# Patient Record
Sex: Male | Born: 1935 | Race: White | Hispanic: Yes | Marital: Married | Smoking: Never smoker
Health system: Southern US, Community
[De-identification: ages and names within clinical notes are randomized; demographics above are authoritative.]

## PROBLEM LIST (undated history)

## (undated) DIAGNOSIS — J8281 Chronic eosinophilic pneumonia: Secondary | ICD-10-CM

## (undated) DIAGNOSIS — I509 Heart failure, unspecified: Secondary | ICD-10-CM

## (undated) HISTORY — PX: HIP SURGERY: SHX245

## (undated) HISTORY — PX: APPENDECTOMY: SHX54

## (undated) HISTORY — DX: Chronic eosinophilic pneumonia: J82.81

## (undated) HISTORY — PX: KNEE SURGERY: SHX244

## (undated) HISTORY — PX: CATARACT EXTRACTION: SUR2

## (undated) HISTORY — DX: Heart failure, unspecified: I50.9

## (undated) HISTORY — PX: MASTOIDECTOMY: SHX711

---

## 2020-05-17 ENCOUNTER — Other Ambulatory Visit: Payer: Self-pay

## 2020-05-17 ENCOUNTER — Encounter (HOSPITAL_BASED_OUTPATIENT_CLINIC_OR_DEPARTMENT_OTHER): Payer: Self-pay

## 2020-05-17 ENCOUNTER — Emergency Department (HOSPITAL_BASED_OUTPATIENT_CLINIC_OR_DEPARTMENT_OTHER)
Admission: EM | Admit: 2020-05-17 | Discharge: 2020-05-17 | Disposition: A | Discharge status: Home / Self Care | Payer: Medicare Other | Attending: Emergency Medicine | Admitting: Emergency Medicine

## 2020-05-17 DIAGNOSIS — Y9289 Other specified places as the place of occurrence of the external cause: Secondary | ICD-10-CM | POA: Insufficient documentation

## 2020-05-17 DIAGNOSIS — M795 Residual foreign body in soft tissue: Secondary | ICD-10-CM | POA: Diagnosis present

## 2020-05-17 DIAGNOSIS — W458XXA Other foreign body or object entering through skin, initial encounter: Secondary | ICD-10-CM | POA: Diagnosis not present

## 2020-05-17 DIAGNOSIS — T148XXA Other injury of unspecified body region, initial encounter: Secondary | ICD-10-CM | POA: Diagnosis not present

## 2020-05-17 DIAGNOSIS — I509 Heart failure, unspecified: Secondary | ICD-10-CM | POA: Insufficient documentation

## 2020-05-17 DIAGNOSIS — Y998 Other external cause status: Secondary | ICD-10-CM | POA: Diagnosis not present

## 2020-05-17 DIAGNOSIS — Y9389 Activity, other specified: Secondary | ICD-10-CM | POA: Diagnosis not present

## 2020-05-17 MED ORDER — BACITRACIN ZINC 500 UNIT/GM EX OINT: TOPICAL_OINTMENT | Freq: Once | CUTANEOUS | Status: DC

## 2020-05-17 MED ORDER — LIDOCAINE HCL (PF) 1 % IJ SOLN
5.0000 mL | Freq: Once | INTRAMUSCULAR | Status: AC
  Administered 2020-05-17: 5 mL
  Filled 2020-05-17: qty 5

## 2020-05-17 MED ORDER — LIDOCAINE HCL (PF) 1 % IJ SOLN
INTRAMUSCULAR | Status: AC
  Filled 2020-05-17: qty 5

## 2020-05-17 NOTE — ED Notes (Signed)
Area cleaned, sm amt of bacitracin oint applied, bandage then applied. Pt instructed on keeping area clean and dry and observing for signs and symptoms of infection. Opportunity for questions provided. Copy of AVS provided

## 2020-05-17 NOTE — ED Notes (Signed)
Presents with fish hook in left 4th digit, no active bleeding noted. Pt states he had a tetanus 2 weeks ago. ED PA at bedside

## 2020-05-17 NOTE — Discharge Instructions (Signed)
Follow instructions provided in this packet.  Please wash the area with soap and water several times a day over the next several days.  Return immediately with worsening pain, swelling, difficulty bending or extending your finger, significant redness or other concerns.

## 2020-05-17 NOTE — ED Provider Notes (Signed)
[facility] EMERGENCY DEPARTMENT Provider Note   CSN: [id number] Arrival date & time: 05/17/20  1455     History Chief Complaint  Patient presents with  . Foreign Body in Skin    George Roman is a 86 y.o. male.  Patient presents to the emergency department after he got a fishhook embedded in his left fourth digit approximately 1 hour prior to arrival.  George Roman was not being used and was reportedly clean.  Last tetanus 2 weeks ago.  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  He denies any numbness or tingling of the finger.  No treatments prior to arrival.        Past Medical History:  Diagnosis Date  . CHF (congestive heart failure) (HCC)   . Idiopathic eosinophilic pneumonia     There are no problems to display for this patient.   Past Surgical History:  Procedure Laterality Date  . APPENDECTOMY    . CATARACT EXTRACTION    . HIP SURGERY    . KNEE SURGERY    . MASTOIDECTOMY         No family history on file.  Social History   Tobacco Use  . Smoking status: Never Smoker  . Smokeless tobacco: Never Used  Vaping Use  . Vaping Use: Never used  Substance Use Topics  . Alcohol use: Yes    Comment: daily  . Drug use: Never    Home Medications Prior to Admission medications   Not on File    Allergies    Patient has no known allergies.  Review of Systems   Review of Systems  Musculoskeletal: Negative for joint swelling.  Skin: Positive for wound.  Neurological: Negative for weakness and numbness.    Physical Exam Updated Vital Signs BP (!) 138/97 (BP Location: Right Arm)   Pulse 94   Temp 98.5 F (36.9 C) (Oral)   Resp 18   Ht 5\' 7"  (1.702 m)   Wt 74.8 kg   SpO2 96%   BMI 25.84 kg/m   Physical Exam Vitals and nursing note reviewed.  Constitutional:      Appearance: He is well-developed.  HENT:     Head: Normocephalic and atraumatic.  Eyes:     Conjunctiva/sclera:  Conjunctivae normal.  Pulmonary:     Effort: No respiratory distress.  Musculoskeletal:     Cervical back: Normal range of motion and neck supple.  Skin:    General: Skin is warm and dry.     Comments: Left ring finger: There is a small fishhook embedded on the dorsal aspect of the finger at the level of the PIP joint.  No active bleeding.  Distal sensation intact.  Normal capillary refill at the tip of the digit, less than 2 seconds.  I can palpate the hook and tip, and it is superficial to the bone.  Neurological:     Mental Status: He is alert.     ED Results / Procedures / Treatments   Labs (all labs ordered are listed, but only abnormal results are displayed) Labs Reviewed - No data to display  EKG None  Radiology No results found.  Procedures .Foreign Body Removal  Date/Time: 05/17/2020 4:47 PM Performed by: 07/18/2020, PA-C Authorized by: Renne Crigler, PA-C  Body area: skin General location: upper extremity Location details: left ring finger Anesthesia: local infiltration  Anesthesia: Local Anesthetic: lidocaine 1% without epinephrine Anesthetic total: 1 mL  Localization method: visualized (Small incision made at the hook point to facilitate delivery through skin) Removal mechanism: scalpel Dressing: dressing applied Tendon involvement: none Depth: subcutaneous Complexity: simple 1 objects recovered. Objects recovered: fish hook Post-procedure assessment: foreign body removed Patient tolerance: patient tolerated the procedure well with no immediate complications   (including critical care time)  Medications Ordered in ED Medications  lidocaine (PF) (XYLOCAINE) 1 % injection (has no administration in time range)  bacitracin ointment (has no administration in time range)  lidocaine (PF) (XYLOCAINE) 1 % injection 5 mL (5 mLs Infiltration Given 05/17/20 1631)    ED Course  I have reviewed the triage vital signs and the nursing notes.  Pertinent labs &  imaging results that were available during my care of the patient were reviewed by me and considered in my medical decision making (see chart for details).  Patient seen and examined.  Foreign body removal as denoted.  Patient tolerated well.  After the procedure, I did patient extend his finger at each of the joints and he had excellent extension of the finger with 5 out of 5 strength.  I have low concern for an extensor tendon injury.  Wound care discussed extensively with patient and wife at bedside.  We discussed signs and symptoms to return including worsening pain, swelling, decreased range of motion of the finger, purulent drainage from the finger.  Vital signs reviewed and are as follows: BP (!) 138/97 (BP Location: Right Arm)   Pulse 94   Temp 98.5 F (36.9 C) (Oral)   Resp 18   Ht 5\' 7"  (1.702 m)   Wt 74.8 kg   SpO2 96%   BMI 25.84 kg/m   States that he has a clinic appointment on Tuesday and will have them take a look at the wound then.    MDM Rules/Calculators/A&P                          Patient was fishhook removal.  Fishhook was embedded superficially into the skin.  I have low concern for extensor tendon, nerve, vascular injury.  Patient with normal strength and circulation after procedure.  Patient counseled on wound care as this would be necessary over the next few days.  It is pointed out feel that antibiotics are warranted given minor nature of injury.  Patient counseled on signs and symptoms to return and seems reliable to return if these occur.  Tetanus is up-to-date.   Final Clinical Impression(s) / ED Diagnoses Final diagnoses:  Skin foreign body    Rx / DC Orders ED Discharge Orders    None       Tuesday, Renne Crigler 05/17/20 1650    07/18/20, MD 05/17/20 2037

## 2020-05-17 NOTE — ED Triage Notes (Signed)
Pt with fish hook to left ring finger x 1 hour-NAD-steady gait

## 2021-01-16 ENCOUNTER — Other Ambulatory Visit: Payer: Self-pay

## 2021-01-16 ENCOUNTER — Emergency Department (HOSPITAL_BASED_OUTPATIENT_CLINIC_OR_DEPARTMENT_OTHER): Payer: Medicare Other

## 2021-01-16 ENCOUNTER — Encounter (HOSPITAL_BASED_OUTPATIENT_CLINIC_OR_DEPARTMENT_OTHER): Payer: Self-pay | Admitting: *Deleted

## 2021-01-16 ENCOUNTER — Emergency Department (HOSPITAL_BASED_OUTPATIENT_CLINIC_OR_DEPARTMENT_OTHER)
Admission: EM | Admit: 2021-01-16 | Discharge: 2021-01-16 | Disposition: A | Discharge status: Home / Self Care | Payer: Medicare Other | Attending: Emergency Medicine | Admitting: Emergency Medicine

## 2021-01-16 DIAGNOSIS — S01111A Laceration without foreign body of right eyelid and periocular area, initial encounter: Secondary | ICD-10-CM | POA: Insufficient documentation

## 2021-01-16 DIAGNOSIS — S0990XA Unspecified injury of head, initial encounter: Secondary | ICD-10-CM | POA: Insufficient documentation

## 2021-01-16 DIAGNOSIS — W010XXA Fall on same level from slipping, tripping and stumbling without subsequent striking against object, initial encounter: Secondary | ICD-10-CM | POA: Diagnosis not present

## 2021-01-16 DIAGNOSIS — I6782 Cerebral ischemia: Secondary | ICD-10-CM | POA: Insufficient documentation

## 2021-01-16 DIAGNOSIS — S61411A Laceration without foreign body of right hand, initial encounter: Secondary | ICD-10-CM | POA: Insufficient documentation

## 2021-01-16 DIAGNOSIS — I509 Heart failure, unspecified: Secondary | ICD-10-CM | POA: Insufficient documentation

## 2021-01-16 DIAGNOSIS — W19XXXA Unspecified fall, initial encounter: Secondary | ICD-10-CM

## 2021-01-16 DIAGNOSIS — Y92481 Parking lot as the place of occurrence of the external cause: Secondary | ICD-10-CM | POA: Diagnosis not present

## 2021-01-16 DIAGNOSIS — S80211A Abrasion, right knee, initial encounter: Secondary | ICD-10-CM | POA: Insufficient documentation

## 2021-01-16 DIAGNOSIS — S0591XA Unspecified injury of right eye and orbit, initial encounter: Secondary | ICD-10-CM | POA: Diagnosis present

## 2021-01-16 DIAGNOSIS — Z23 Encounter for immunization: Secondary | ICD-10-CM | POA: Diagnosis not present

## 2021-01-16 MED ORDER — TETANUS-DIPHTH-ACELL PERTUSSIS 5-2.5-18.5 LF-MCG/0.5 IM SUSY
0.5000 mL | PREFILLED_SYRINGE | Freq: Once | INTRAMUSCULAR | Status: AC
  Administered 2021-01-16: 0.5 mL via INTRAMUSCULAR
  Filled 2021-01-16: qty 0.5

## 2021-01-16 MED ORDER — BACITRACIN ZINC 500 UNIT/GM EX OINT: TOPICAL_OINTMENT | Freq: Once | CUTANEOUS | Status: AC

## 2021-01-16 MED ORDER — LIDOCAINE-EPINEPHRINE (PF) 2 %-1:200000 IJ SOLN
20.0000 mL | Freq: Once | INTRAMUSCULAR | Status: AC
  Administered 2021-01-16: 20 mL via INTRADERMAL
  Filled 2021-01-16: qty 20

## 2021-01-16 NOTE — ED Triage Notes (Signed)
Pt. ;reports he was putting his groceries on a cart and the cart got away and he ran away from them and he was running after the cart and fell on the asphalt.  Pt. Had no LOC at time of fall or after.  Pt. Is alert and oriented.  Pt. Did lacerate the outer corner of the R eye at the eyebrow area.  Pt. Also cut the R hand at the Thumb and top side the the R hand the the thumb. Bleeding is controlled but still has some bleeding noted.

## 2021-01-16 NOTE — ED Notes (Signed)
Marva RN is working on [employer]. Skin and placing dressing and other for Pt. Care.  Pt. Is tolerating care well. Marva Charge RN explaining everything as she does it.

## 2021-01-16 NOTE — ED Notes (Signed)
Pt is from [employer]. No calling of report is necessary.

## 2021-01-16 NOTE — ED Triage Notes (Signed)
Pt. Also has a R knee abrasion with bruising and skin disruption.

## 2021-01-16 NOTE — ED Provider Notes (Signed)
[facility] EMERGENCY DEPARTMENT Provider Note   CSN: [id number] Arrival date & time: 01/16/21  1249     History Chief Complaint  Patient presents with  . Fall    George Roman is a 87 y.o. male.  HPI 87 year old male presents after a trip and fall in the parking lot of a grocery store.  He was trying to catch a cart that was running away from him and stumbled and fell.  He has a laceration to his right eyebrow as well as multiple laceration/skin tears to his right hand.  He also scraped his right knee.  He denies any pain besides the lacerations themselves.  Does not think he lost consciousness.  No presyncopal symptoms.  He is on a baby aspirin. He thinks his last tetanus immunization was about 5 years ago.   Past Medical History:  Diagnosis Date  . CHF (congestive heart failure) (HCC)   . Idiopathic eosinophilic pneumonia (HCC)     There are no problems to display for this patient.   Past Surgical History:  Procedure Laterality Date  . APPENDECTOMY    . CATARACT EXTRACTION    . HIP SURGERY    . KNEE SURGERY    . MASTOIDECTOMY         No family history on file.  Social History   Tobacco Use  . Smoking status: Never Smoker  . Smokeless tobacco: Never Used  Vaping Use  . Vaping Use: Never used  Substance Use Topics  . Alcohol use: Yes    Comment: daily  . Drug use: Never    Home Medications Prior to Admission medications   Not on File    Allergies    Patient has no known allergies.  Review of Systems   Review of Systems  Cardiovascular: Negative for chest pain.  Skin: Positive for wound.  Neurological: Negative for dizziness and headaches.    Physical Exam Updated Vital Signs BP 133/88 (BP Location: Right Arm)   Pulse 82   Temp 98.9 F (37.2 C) (Oral)   Resp 20   Ht 5\' 7"  (1.702 m)   Wt 72.3 kg   SpO2 96%   BMI 24.97 kg/m   Physical Exam Vitals and nursing note reviewed.  Constitutional:      Appearance: He is  well-developed and well-nourished.  HENT:     Head: Normocephalic. Laceration present.      Right Ear: External ear normal.     Left Ear: External ear normal.     Nose: Nose normal.  Eyes:     General:        Right eye: No discharge.        Left eye: No discharge.     Extraocular Movements: Extraocular movements intact.     Pupils: Pupils are equal, round, and reactive to light.  Cardiovascular:     Rate and Rhythm: Normal rate and regular rhythm.     Pulses:          Radial pulses are 2+ on the right side.     Heart sounds: Normal heart sounds.  Pulmonary:     Effort: Pulmonary effort is normal.     Breath sounds: Normal breath sounds.  Abdominal:     Palpations: Abdomen is soft.     Tenderness: There is no abdominal tenderness.  Musculoskeletal:        General: No edema.     Right hand: Laceration present.     Cervical back: Neck supple.  Right knee: Laceration (abrasion) present. Normal range of motion. No tenderness.     Comments: Along the thenar hand there is a long superficial laceration/skin tear with loss of skin On the ulnar hand he has 2 superficial laceration parallel to each other along with skin tear On the palmar hand there is a half-dollar shaped laceration/skin tear  Skin:    General: Skin is warm and dry.  Neurological:     Mental Status: He is alert.     Comments: CN 3-12 grossly intact. 5/5 strength in all 4 extremities. Grossly normal sensation. Normal finger to nose.   Psychiatric:        Mood and Affect: Mood is not anxious.     ED Results / Procedures / Treatments   Labs (all labs ordered are listed, but only abnormal results are displayed) Labs Reviewed - No data to display  EKG None  Radiology CT Head Wo Contrast  Result Date: 01/16/2021 CLINICAL DATA:  Fall.  Head injury EXAM: CT HEAD WITHOUT CONTRAST TECHNIQUE: Contiguous axial images were obtained from the base of the skull through the vertex without intravenous contrast. COMPARISON:   CT head 12/17/2020 FINDINGS: Brain: Generalized atrophy. Mild white matter hypodensity bilaterally, unchanged. Negative for acute infarct, hemorrhage, mass. Vascular: Negative for hyperdense vessel. Atherosclerotic calcification in the carotid and vertebral arteries. Skull: Negative for skull fracture. Sinuses/Orbits: Prior sinus surgery with bony thickening of the maxillary sinus bilaterally and associated mucosal edema. Mucosal edema in the frontal and ethmoid sinuses. Mucosal edema and bony thickening of the sphenoid sinus. Right mastoidectomy. Left mastoid sinus clear. No orbital lesion. Bilateral ocular surgery. Other: None IMPRESSION: No acute abnormality. Atrophy and chronic microvascular ischemic changes in the white matter. Electronically Signed   By: Marlan Palauharles  Clark M.D.   On: 01/16/2021 14:32    Procedures .Marland Kitchen.Laceration Repair  Date/Time: 01/16/2021 3:39 PM Performed by: Pricilla LovelessGoldston, Saxon Barich, MD Authorized by: Pricilla LovelessGoldston, Kendle Turbin, MD   Consent:    Consent obtained:  Verbal   Consent given by:  Patient Universal protocol:    Patient identity confirmed:  Verbally with patient Anesthesia:    Anesthesia method:  Local infiltration   Local anesthetic:  Lidocaine 2% WITH epi Laceration details:    Location:  Face   Face location:  R eyebrow   Length (cm):  3 Pre-procedure details:    Preparation:  Patient was prepped and draped in usual sterile fashion Exploration:    Limited defect created (wound extended): no     Imaging outcome: foreign body not noted   Treatment:    Area cleansed with:  Saline   Amount of cleaning:  Standard   Irrigation solution:  Sterile saline   Irrigation method:  Syringe Skin repair:    Repair method:  Sutures   Suture size:  5-0   Suture material:  Fast-absorbing gut   Suture technique:  Simple interrupted   Number of sutures:  4 Approximation:    Approximation:  Close Repair type:    Repair type:  Intermediate Post-procedure details:    Dressing:   Antibiotic ointment   Procedure completion:  Tolerated well, no immediate complications .Marland Kitchen.Laceration Repair  Date/Time: 01/16/2021 3:40 PM Performed by: Pricilla LovelessGoldston, Aiko Belko, MD Authorized by: Pricilla LovelessGoldston, Abagayle Klutts, MD   Consent:    Consent obtained:  Verbal   Consent given by:  Patient Anesthesia:    Anesthesia method:  Local infiltration   Local anesthetic:  Lidocaine 2% WITH epi Laceration details:    Location:  Hand   Hand  location:  R hand, dorsum   Length (cm):  4 Pre-procedure details:    Preparation:  Patient was prepped and draped in usual sterile fashion Treatment:    Area cleansed with:  Saline   Debridement:  None Skin repair:    Repair method:  Sutures   Suture size:  4-0   Suture material:  Prolene   Suture technique:  Simple interrupted   Number of sutures:  4 Approximation:    Approximation:  Close Repair type:    Repair type:  Simple Post-procedure details:    Dressing:  Bulky dressing and non-adherent dressing   Procedure completion:  Tolerated well, no immediate complications .Marland KitchenLaceration Repair  Date/Time: 01/16/2021 3:41 PM Performed by: Pricilla Loveless, MD Authorized by: Pricilla Loveless, MD   Consent:    Consent obtained:  Verbal   Consent given by:  Patient Laceration details:    Location:  Hand   Hand location:  R hand, dorsum   Length (cm):  3 Pre-procedure details:    Preparation:  Patient was prepped and draped in usual sterile fashion Treatment:    Area cleansed with:  Saline   Amount of cleaning:  Standard   Debridement:  None Skin repair:    Repair method:  Sutures   Suture size:  4-0   Suture material:  Prolene   Suture technique:  Simple interrupted   Number of sutures:  3 Approximation:    Approximation:  Close Repair type:    Repair type:  Simple Post-procedure details:    Dressing:  Bulky dressing and non-adherent dressing   Procedure completion:  Tolerated well, no immediate complications .Marland KitchenLaceration Repair  Date/Time:  01/16/2021 3:41 PM Performed by: Pricilla Loveless, MD Authorized by: Pricilla Loveless, MD   Consent:    Consent obtained:  Verbal   Consent given by:  Patient Anesthesia:    Anesthesia method:  None Laceration details:    Location:  Hand   Hand location:  R palm   Length (cm):  3 Exploration:    Contaminated: no   Treatment:    Area cleansed with:  Saline   Amount of cleaning:  Standard   Debridement:  None Skin repair:    Repair method:  Steri-Strips (dermaclip)   Number of Steri-Strips:  2 Approximation:    Approximation:  Close Repair type:    Repair type:  Simple Post-procedure details:    Dressing:  Non-adherent dressing and adhesive bandage   Procedure completion:  Tolerated well, no immediate complications .Marland KitchenLaceration Repair  Date/Time: 01/16/2021 3:42 PM Performed by: Pricilla Loveless, MD Authorized by: Pricilla Loveless, MD   Consent:    Consent obtained:  Verbal   Consent given by:  Patient Anesthesia:    Anesthesia method:  None Laceration details:    Location:  Hand   Hand location:  R palm   Length (cm):  7 Treatment:    Area cleansed with:  Saline   Amount of cleaning:  Standard Skin repair:    Repair method:  Steri-Strips (dermaclip)   Number of Steri-Strips:  4 Approximation:    Approximation:  Close Post-procedure details:    Dressing:  Non-adherent dressing and bulky dressing   Procedure completion:  Tolerated well, no immediate complications     Medications Ordered in ED Medications  Tdap (BOOSTRIX) injection 0.5 mL (has no administration in time range)  lidocaine-EPINEPHrine (XYLOCAINE W/EPI) 2 %-1:200000 (PF) injection 20 mL (has no administration in time range)  bacitracin ointment (has no administration in time range)  ED Course  I have reviewed the triage vital signs and the nursing notes.  Pertinent labs & imaging results that were available during my care of the patient were reviewed by me and considered in my medical decision  making (see chart for details).    MDM Rules/Calculators/A&P                          Patient with a mechanical fall and multiple lacerations as above.  These were repaired as best as possible including the significant skin tears.  These are mostly closed with derma clip and Steri-Strip.  The other actual lacerations were repaired with sutures as above.  CT head is unremarkable. He declined x-ray of the hand.  He is neurovascular intact.  Tdap will be updated. Final Clinical Impression(s) / ED Diagnoses Final diagnoses:  Fall, initial encounter  Laceration of right eyebrow, initial encounter  Laceration of right hand without foreign body, initial encounter    Rx / DC Orders ED Discharge Orders    None       Pricilla Loveless, MD 01/16/21 1544

## 2022-03-08 IMAGING — CT CT HEAD W/O CM
3 series · 16 of 47 positions shown, 19 images · non-contrast
Comparison: CT head 12/17/2020

CLINICAL DATA: Fall.  Head injury

EXAM:
CT HEAD WITHOUT CONTRAST
TECHNIQUE: Contiguous axial images were obtained from the base of the skull
through the vertex without intravenous contrast.

[Series 2: head wo · axial · 0.43mm/px · z∈[-167,-27]mm · 10 of 34 slices shown, 13 images]
[im 3/34  brain]
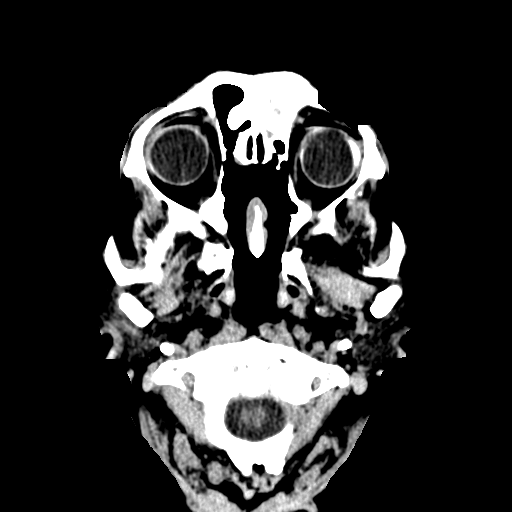
[im 3/34  bone]
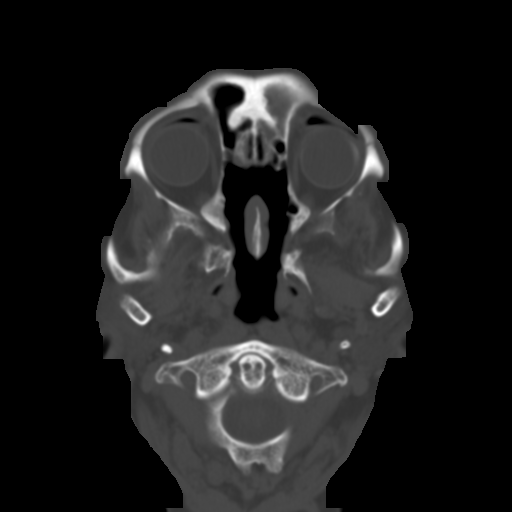
[im 6/34  brain]
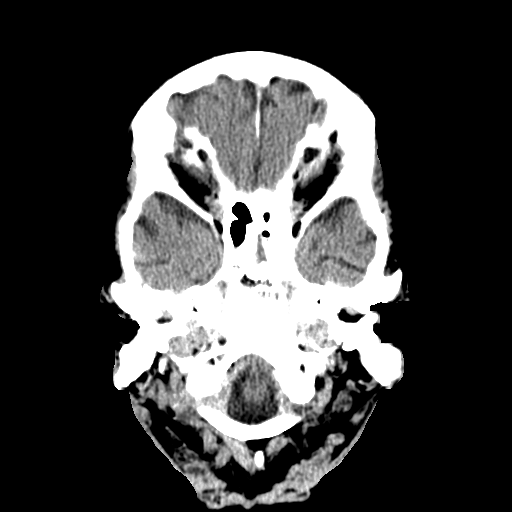
[im 10/34  brain]
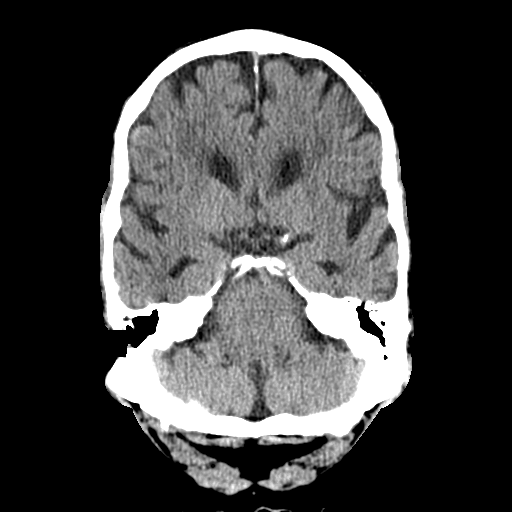
[im 12/34  brain]
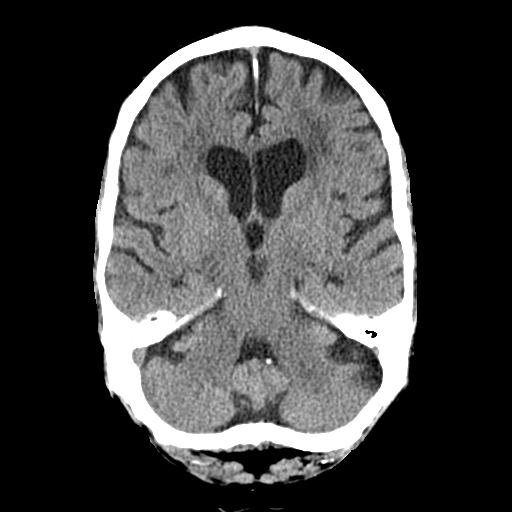
[im 15/34  brain]
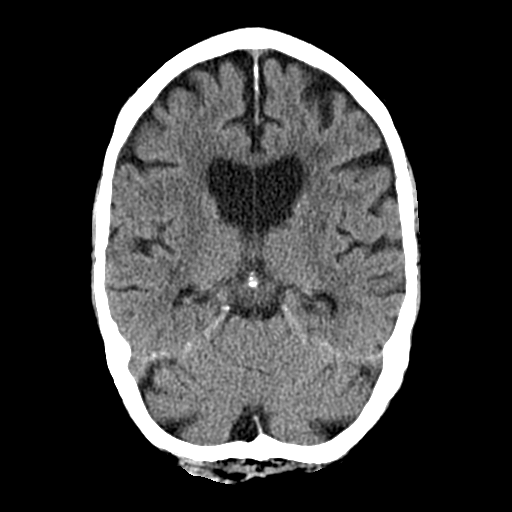
[im 15/34  bone]
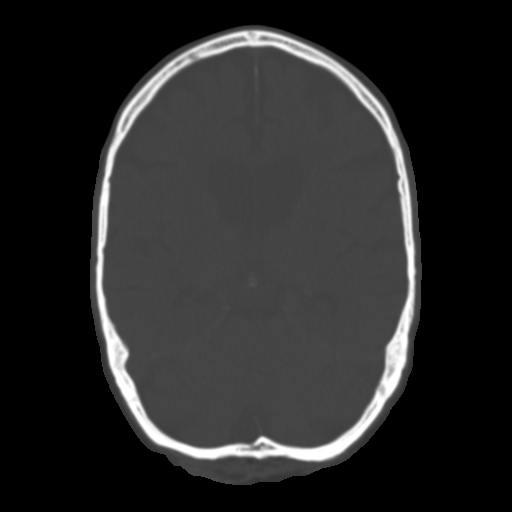
[im 19/34  brain]
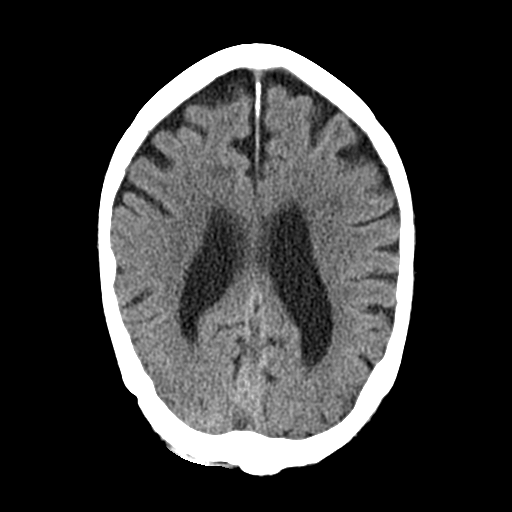
[im 22/34  brain]
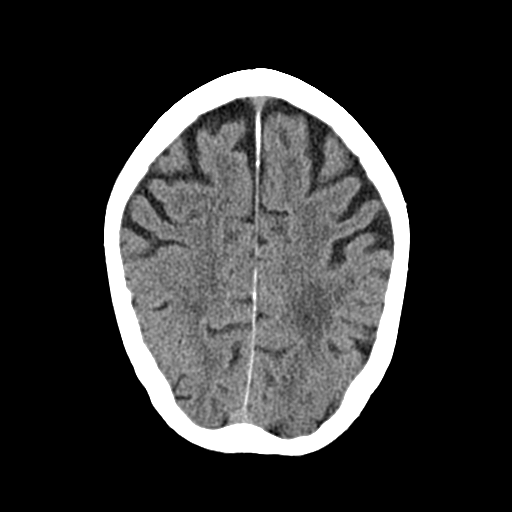
[im 26/34  brain]
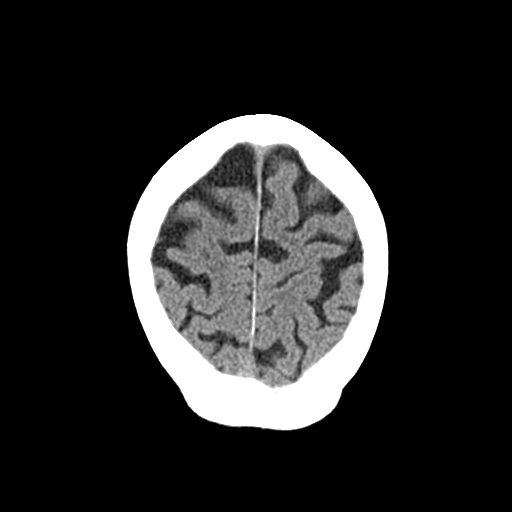
[im 28/34  brain]
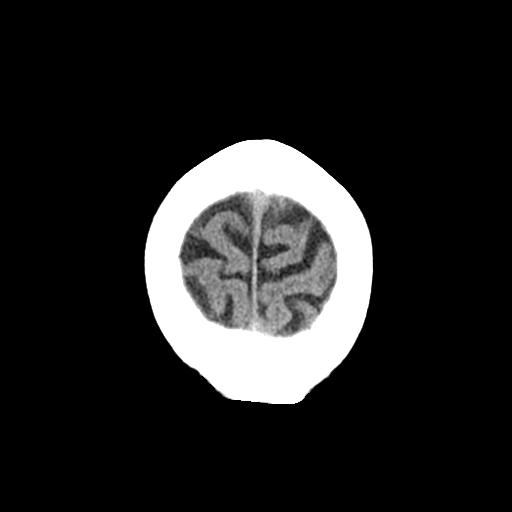
[im 28/34  bone]
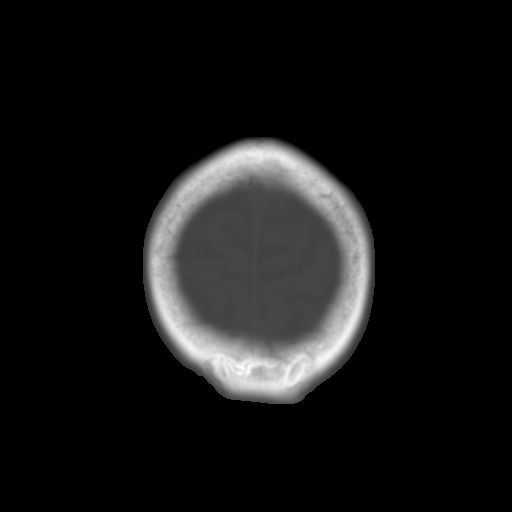
[im 31/34  brain]
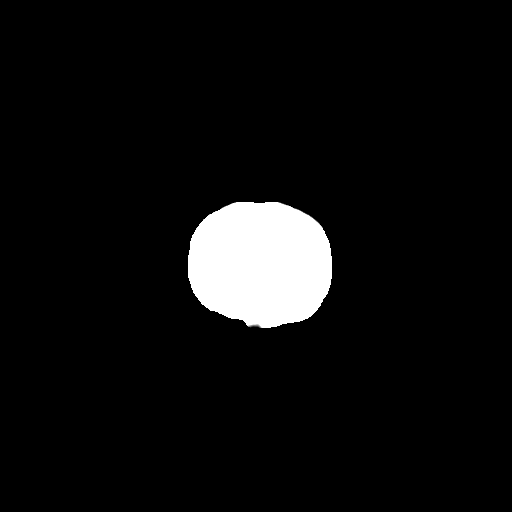

[Series 4: cor soft · coronal · 0.34mm/px · 3 of 71 slices shown]
[im 24/71  brain]
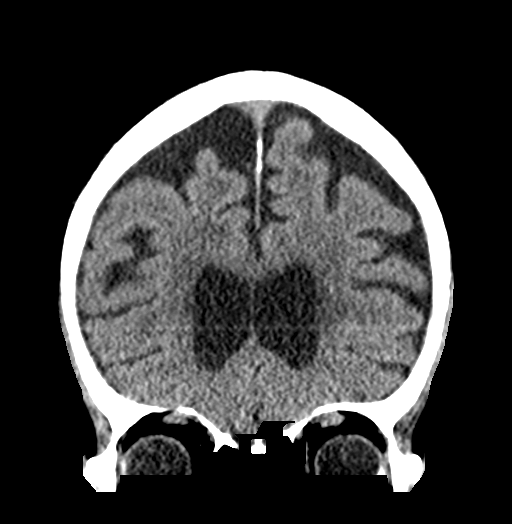
[im 32/71  brain]
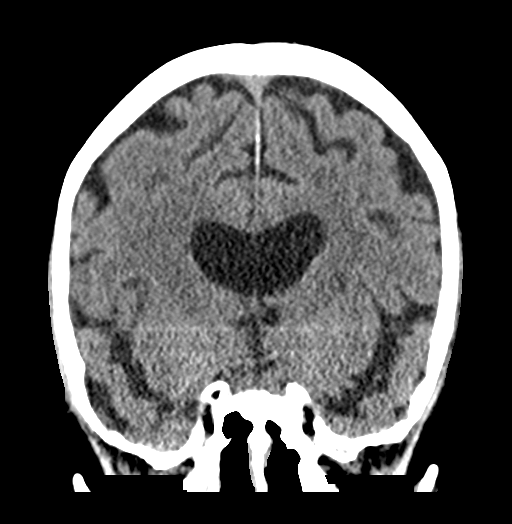
[im 39/71  brain]
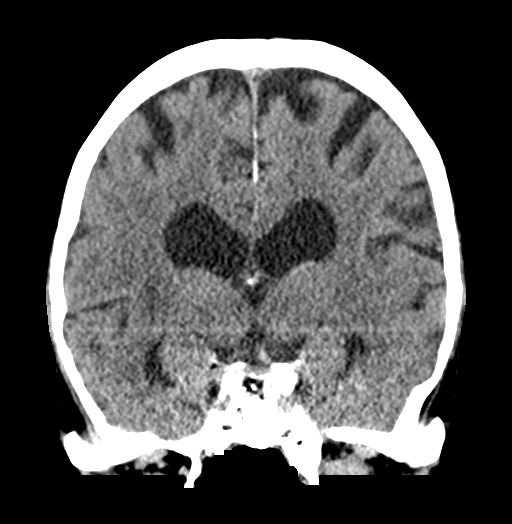

[Series 5: sag soft · sagittal · 0.35mm/px · 3 of 52 slices shown]
[im 18/52  brain]
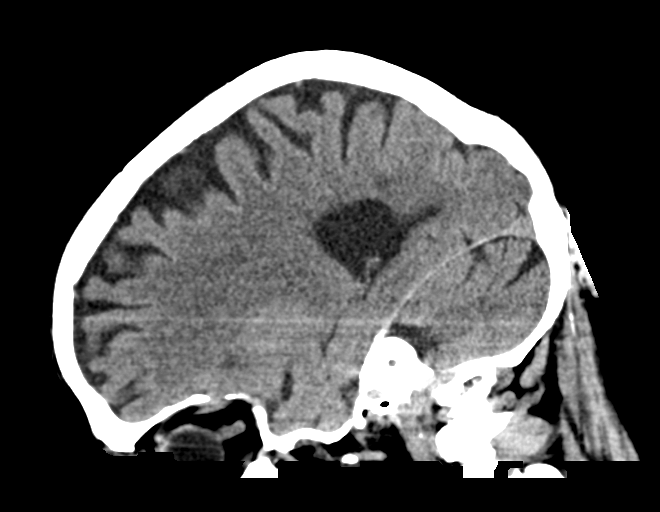
[im 26/52  brain]
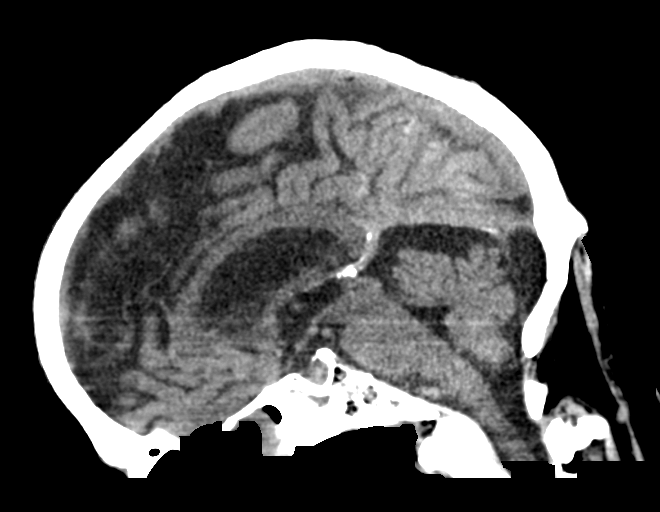
[im 35/52  brain]
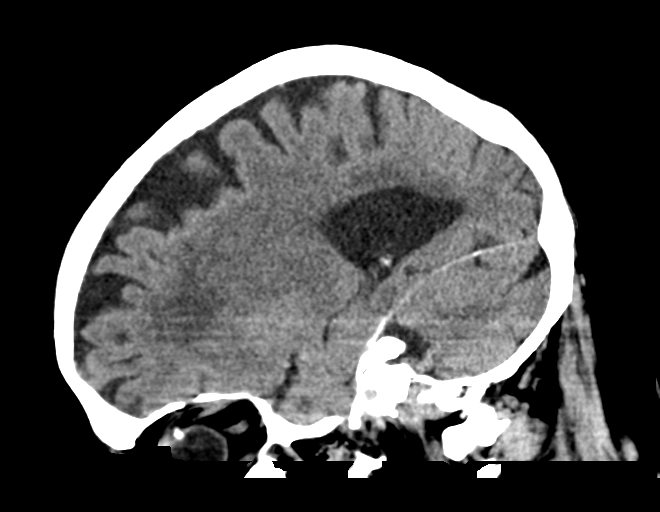

[16 of 47 positions shown; findings below may reference images not displayed]

FINDINGS: Brain: Generalized atrophy. Mild white matter hypodensity
bilaterally, unchanged.

Negative for acute infarct, hemorrhage, mass.

Vascular: Negative for hyperdense vessel. Atherosclerotic
calcification in the carotid and vertebral arteries.

Skull: Negative for skull fracture.

Sinuses/Orbits: Prior sinus surgery with bony thickening of the
maxillary sinus bilaterally and associated mucosal edema. Mucosal
edema in the frontal and ethmoid sinuses. Mucosal edema and bony
thickening of the sphenoid sinus. Right mastoidectomy. Left mastoid
sinus clear. No orbital lesion. Bilateral ocular surgery.

Other: None
IMPRESSION: No acute abnormality. Atrophy and chronic microvascular ischemic
changes in the white matter.

## 2022-07-19 ENCOUNTER — Emergency Department (HOSPITAL_BASED_OUTPATIENT_CLINIC_OR_DEPARTMENT_OTHER): Payer: Medicare Other

## 2022-07-19 ENCOUNTER — Encounter (HOSPITAL_BASED_OUTPATIENT_CLINIC_OR_DEPARTMENT_OTHER): Payer: Self-pay

## 2022-07-19 ENCOUNTER — Other Ambulatory Visit: Payer: Self-pay

## 2022-07-19 ENCOUNTER — Emergency Department (HOSPITAL_BASED_OUTPATIENT_CLINIC_OR_DEPARTMENT_OTHER)
Admission: EM | Admit: 2022-07-19 | Discharge: 2022-07-19 | Disposition: A | Discharge status: Home / Self Care | Payer: Medicare Other | Attending: Emergency Medicine | Admitting: Emergency Medicine

## 2022-07-19 DIAGNOSIS — R1033 Periumbilical pain: Secondary | ICD-10-CM | POA: Diagnosis not present

## 2022-07-19 DIAGNOSIS — R109 Unspecified abdominal pain: Secondary | ICD-10-CM | POA: Diagnosis present

## 2022-07-19 LAB — COMPREHENSIVE METABOLIC PANEL
ALT: 19 U/L (ref 0–44)
AST: 23 U/L (ref 15–41)
Albumin: 3.6 g/dL (ref 3.5–5.0)
Alkaline Phosphatase: 56 U/L (ref 38–126)
Anion gap: 6 (ref 5–15)
BUN: 22 mg/dL (ref 8–23)
CO2: 25 mmol/L (ref 22–32)
Calcium: 8.9 mg/dL (ref 8.9–10.3)
Chloride: 104 mmol/L (ref 98–111)
Creatinine, Ser: 1.16 mg/dL (ref 0.61–1.24)
GFR, Estimated: >60 mL/min (ref >60)
Glucose, Bld: 102 mg/dL — ABNORMAL HIGH (ref 70–99)
Potassium: 3.8 mmol/L (ref 3.5–5.1)
Sodium: 135 mmol/L (ref 135–145)
Total Bilirubin: 1 mg/dL (ref 0.3–1.2)
Total Protein: 6.4 g/dL — ABNORMAL LOW (ref 6.5–8.1)

## 2022-07-19 LAB — CBC WITH DIFFERENTIAL/PLATELET
Abs Immature Granulocytes: 0.01 10*3/uL (ref 0.00–0.07)
Basophils Absolute: 0.1 10*3/uL (ref 0.0–0.1)
Basophils Relative: 1 %
Eosinophils Absolute: 0.7 10*3/uL — ABNORMAL HIGH (ref 0.0–0.5)
Eosinophils Relative: 11 %
HCT: 37.9 % — ABNORMAL LOW (ref 39.0–52.0)
Hemoglobin: 13 g/dL (ref 13.0–17.0)
Immature Granulocytes: 0 %
Lymphocytes Relative: 30 %
Lymphs Abs: 2 10*3/uL (ref 0.7–4.0)
MCH: 33.3 pg (ref 26.0–34.0)
MCHC: 34.3 g/dL (ref 30.0–36.0)
MCV: 97.2 fL (ref 80.0–100.0)
Monocytes Absolute: 0.9 10*3/uL (ref 0.1–1.0)
Monocytes Relative: 13 %
Neutro Abs: 3.1 10*3/uL (ref 1.7–7.7)
Neutrophils Relative %: 45 %
Platelets: 139 10*3/uL — ABNORMAL LOW (ref 150–400)
RBC: 3.9 MIL/uL — ABNORMAL LOW (ref 4.22–5.81)
RDW: 13.2 % (ref 11.5–15.5)
WBC: 6.8 10*3/uL (ref 4.0–10.5)
nRBC: 0 % (ref 0.0–0.2)

## 2022-07-19 LAB — URINALYSIS, ROUTINE W REFLEX MICROSCOPIC
Bilirubin Urine: NEGATIVE
Glucose, UA: NEGATIVE mg/dL
Ketones, ur: NEGATIVE mg/dL
Leukocytes,Ua: NEGATIVE
Nitrite: NEGATIVE
Protein, ur: NEGATIVE mg/dL
Specific Gravity, Urine: 1.02 (ref 1.005–1.030)
pH: 7 (ref 5.0–8.0)

## 2022-07-19 LAB — URINALYSIS, MICROSCOPIC (REFLEX)

## 2022-07-19 LAB — LIPASE, BLOOD: Lipase: 36 U/L (ref 11–51)

## 2022-07-19 MED ORDER — SODIUM CHLORIDE 0.9 % IV BOLUS
500.0000 mL | Freq: Once | INTRAVENOUS | Status: AC
  Administered 2022-07-19: 500 mL via INTRAVENOUS

## 2022-07-19 MED ORDER — ONDANSETRON HCL 4 MG/2ML IJ SOLN
4.0000 mg | Freq: Once | INTRAMUSCULAR | Status: AC
  Administered 2022-07-19: 4 mg via INTRAVENOUS
  Filled 2022-07-19: qty 2

## 2022-07-19 MED ORDER — MORPHINE SULFATE (PF) 4 MG/ML IV SOLN
4.0000 mg | Freq: Once | INTRAVENOUS | Status: AC
  Administered 2022-07-19: 4 mg via INTRAVENOUS
  Filled 2022-07-19: qty 1

## 2022-07-19 MED ORDER — IOHEXOL 300 MG/ML  SOLN
100.0000 mL | Freq: Once | INTRAMUSCULAR | Status: AC | PRN
Start: 2022-07-19 — End: 2022-07-19
  Administered 2022-07-19: 100 mL via INTRAVENOUS

## 2022-07-19 MED ORDER — HYDROCODONE-ACETAMINOPHEN 5-325 MG PO TABS: 1.0000 | ORAL_TABLET | Freq: Four times a day (QID) | ORAL | 0 refills | Status: AC | PRN

## 2022-07-19 NOTE — ED Triage Notes (Signed)
[facility] for gen abd pain for 14 hours. Denies other complaints

## 2022-07-19 NOTE — Discharge Instructions (Signed)
Begin taking hydrocodone as needed for pain.  Follow-up with primary doctor if not improving in the next 3 to 4 days, and return to the ER if you develop high fever, severe abdominal pain, bloody stools, or for other new and concerning symptoms.

## 2022-07-19 NOTE — ED Provider Notes (Signed)
[facility] EMERGENCY DEPARTMENT Provider Note   CSN: [id number] Arrival date & time: 07/19/22  0326     History  Chief Complaint  Patient presents with   Abdominal Pain    George Roman is a 88 y.o. male.  Patient is an 88 year old male with history of coronary artery disease with stents, hypertension, arthritis.  Patient presenting today for evaluation of abdominal pain.  This started approximately noon today in the absence of any injury or trauma.  He describes a constant pain to the center of his stomach with no nausea, vomiting, or diarrhea.  He denies any constipation.  Patient called 911 and was transported here for evaluation.  He denies fevers or chills.  He denies urinary complaints.  Pain is worse with palpation with no alleviating factors.  The history is provided by the patient.       Home Medications Prior to Admission medications   Not on File      Allergies    Patient has no known allergies.    Review of Systems   Review of Systems  All other systems reviewed and are negative.   Physical Exam Updated Vital Signs BP (!) 146/53 (BP Location: Right Arm)   Pulse 82   Temp 98.4 F (36.9 C) (Oral)   Resp 18   Ht 5\' 7"  (1.702 m)   Wt 70.3 kg   SpO2 100%   BMI 24.28 kg/m  Physical Exam Vitals and nursing note reviewed.  Constitutional:      General: He is not in acute distress.    Appearance: He is well-developed. He is not diaphoretic.  HENT:     Head: Normocephalic and atraumatic.  Cardiovascular:     Rate and Rhythm: Normal rate and regular rhythm.     Heart sounds: No murmur heard.    No friction rub.  Pulmonary:     Effort: Pulmonary effort is normal. No respiratory distress.     Breath sounds: Normal breath sounds. No wheezing or rales.  Abdominal:     General: Bowel sounds are normal. There is no distension.     Palpations: Abdomen is soft.     Tenderness: There is abdominal tenderness in the periumbilical area. There is no  right CVA tenderness, left CVA tenderness, guarding or rebound.  Musculoskeletal:        General: Normal range of motion.     Cervical back: Normal range of motion and neck supple.  Skin:    General: Skin is warm and dry.  Neurological:     Mental Status: He is alert and oriented to person, place, and time.     Coordination: Coordination normal.     ED Results / Procedures / Treatments   Labs (all labs ordered are listed, but only abnormal results are displayed) Labs Reviewed - No data to display  EKG None  Radiology No results found.  Procedures Procedures    Medications Ordered in ED Medications  sodium chloride 0.9 % bolus 500 mL (has no administration in time range)  morphine (PF) 4 MG/ML injection 4 mg (has no administration in time range)  ondansetron (ZOFRAN) injection 4 mg (has no administration in time range)    ED Course/ Medical Decision Making/ A&P  This patient presents to the ED for concern of abdominal pain, this involves an extensive number of treatment options, and is a complaint that carries with it a high risk of complications and morbidity.  The differential diagnosis includes peptic ulcers, pancreatitis, acute  cholecystitis, small bowel obstruction, perforated viscus   Co morbidities that complicate the patient evaluation  None   Additional history obtained:  No additional history or external records needed   Lab Tests:  I Ordered, and personally interpreted labs.  The pertinent results include: Unremarkable CBC, CMP, lipase, and urine   Imaging Studies ordered:  I ordered imaging studies including CT scan of the abdomen and pelvis I independently visualized and interpreted imaging which showed no acute intra-abdominal process I agree with the radiologist interpretation   Cardiac Monitoring: / EKG:  None performed   Consultations Obtained:  No emergent consultations indicated   Problem List / ED Course / Critical  interventions / Medication management  Patient is an 88 year old male presenting with complaints of abdominal pain as described in the HPI.  He has mild tenderness in the periumbilical region, but abdomen is soft and there are no peritoneal signs.  His work-up shows no laboratory abnormality and CT scan of the abdomen and pelvis is negative.  The cause of his abdominal pain is unclear, but nothing appears emergent.  He is not vomiting and is requesting something to eat and drink.  I do feel as though patient can safely be discharged with medication for pain and follow-up as needed. I ordered medication including morphine and Zofran for nausea and pain Reevaluation of the patient after these medicines showed that the patient improved I have reviewed the patients home medicines and have made adjustments as needed   Social Determinants of Health:  None   Test / Admission - Considered:  No emergent pathology identified that would necessitate admission..  Patient appears very comfortable and I believe can safely be discharged.  Final Clinical Impression(s) / ED Diagnoses Final diagnoses:  None    Rx / DC Orders ED Discharge Orders     None         Geoffery Lyons, MD 07/19/22 [phone]
